# Patient Record
Sex: Male | Born: 1970 | Race: White | Hispanic: No | Smoking: Never smoker
Health system: Southern US, Community
[De-identification: ages and names within clinical notes are randomized; demographics above are authoritative.]

## PROBLEM LIST (undated history)

## (undated) DIAGNOSIS — M1990 Unspecified osteoarthritis, unspecified site: Secondary | ICD-10-CM

## (undated) DIAGNOSIS — F419 Anxiety disorder, unspecified: Secondary | ICD-10-CM

---

## 2001-11-13 DIAGNOSIS — F419 Anxiety disorder, unspecified: Secondary | ICD-10-CM

## 2001-11-13 HISTORY — DX: Anxiety disorder, unspecified: F41.9

## 2015-08-20 ENCOUNTER — Emergency Department (HOSPITAL_COMMUNITY): Payer: Managed Care, Other (non HMO)

## 2015-08-20 ENCOUNTER — Emergency Department (HOSPITAL_COMMUNITY)
Admission: EM | Admit: 2015-08-20 | Discharge: 2015-08-20 | Disposition: A | Discharge status: Home / Self Care | Payer: Managed Care, Other (non HMO) | Attending: Emergency Medicine | Admitting: Emergency Medicine

## 2015-08-20 ENCOUNTER — Encounter (HOSPITAL_COMMUNITY): Payer: Self-pay | Admitting: Emergency Medicine

## 2015-08-20 DIAGNOSIS — F419 Anxiety disorder, unspecified: Secondary | ICD-10-CM | POA: Diagnosis present

## 2015-08-20 DIAGNOSIS — R0602 Shortness of breath: Secondary | ICD-10-CM | POA: Insufficient documentation

## 2015-08-20 DIAGNOSIS — F410 Panic disorder [episodic paroxysmal anxiety] without agoraphobia: Secondary | ICD-10-CM

## 2015-08-20 DIAGNOSIS — R079 Chest pain, unspecified: Secondary | ICD-10-CM | POA: Diagnosis not present

## 2015-08-20 HISTORY — DX: Anxiety disorder, unspecified: F41.9

## 2015-08-20 HISTORY — DX: Unspecified osteoarthritis, unspecified site: M19.90

## 2015-08-20 LAB — I-STAT TROPONIN, ED
TROPONIN I, POC: 0.01 ng/mL (ref 0.00–0.08)
Troponin i, poc: 0 ng/mL (ref 0.00–0.08)

## 2015-08-20 LAB — I-STAT CHEM 8, ED
BUN: 11 mg/dL (ref 6–20)
CALCIUM ION: 1.16 mmol/L (ref 1.12–1.23)
CHLORIDE: 101 mmol/L (ref 101–111)
Creatinine, Ser: 1.2 mg/dL (ref 0.61–1.24)
GLUCOSE: 93 mg/dL (ref 65–99)
HCT: 47 % (ref 39.0–52.0)
HEMOGLOBIN: 16 g/dL (ref 13.0–17.0)
POTASSIUM: 4.2 mmol/L (ref 3.5–5.1)
SODIUM: 140 mmol/L (ref 135–145)
TCO2: 26 mmol/L (ref 0–100)

## 2015-08-20 NOTE — ED Notes (Signed)
Pt states at the time of the panic attack he continued to sweat and had chest tightness. Currently all issues resolved. Denies any Pain. States he feels a little acid reflux right now but I am fine otherwise.

## 2015-08-20 NOTE — ED Provider Notes (Signed)
CSN: [id number]     Arrival date & time 08/20/15  1226 History  By signing my name below, I, Murriel Hopper, attest that this documentation has been prepared under the direction and in the presence of Wynetta Emery, PA-C  Electronically Signed: Murriel Hopper, ED Scribe. 08/20/2015. 1:11 PM.    Chief Complaint  Patient presents with  . Anxiety      The history is provided by the patient. No language interpreter was used.   HPI Comments: Andrew Steele is a 44 y.o. male who presents to the Emergency Department complaining of an anxiety attack that occurred earlier today while he was at work. Pt states that he began to break out into a sweat, had chest tightness, was shaking, and had SOB for around 5-10 minutes. Pt states his chest tightness lasted about 2-3 minutes consistently. Pt reports having anxiety attacks in the past, but notes this one was different because he normally doesn't sweat like he did today. Pt reports sweat was dripping off of him on all parts of his body. Pt states he currently feels fine. Pt states his father passed away at age 109 from MI. Pt denies suicidal ideations, calf pain, leg swelling.     No past medical history on file. No past surgical history on file. No family history on file. Social History  Substance Use Topics  . Smoking status: Not on file  . Smokeless tobacco: Not on file  . Alcohol Use: Not on file    Review of Systems  A complete 10 system review of systems was obtained and all systems are negative except as noted in the HPI and PMH.    Allergies  Review of patient's allergies indicates not on file.  Home Medications   Prior to Admission medications   Not on File   There were no vitals taken for this visit. Physical Exam  Constitutional: He is oriented to person, place, and time. He appears well-developed and well-nourished. No distress.  HENT:  Head: Normocephalic and atraumatic.  Mouth/Throat: Oropharynx is clear and moist.  Eyes:  Conjunctivae are normal.  Neck: Normal range of motion. No JVD present. No tracheal deviation present.  Cardiovascular: Normal rate, regular rhythm and intact distal pulses.   Radial pulse equal bilaterally  Pulmonary/Chest: Effort normal and breath sounds normal. No stridor. No respiratory distress. He has no wheezes. He has no rales. He exhibits no tenderness.  Abdominal: Soft. He exhibits no distension and no mass. There is no tenderness. There is no rebound and no guarding.  Musculoskeletal: Normal range of motion. He exhibits no edema or tenderness.  No calf asymmetry, superficial collaterals, palpable cords, edema, Homans sign negative bilaterally.    Neurological: He is alert and oriented to person, place, and time.  Skin: Skin is warm and dry. He is not diaphoretic.  Psychiatric: He has a normal mood and affect.  Nursing note and vitals reviewed.   ED Course  Procedures (including critical care time)  DIAGNOSTIC STUDIES: Oxygen Saturation is 97% on room air, normal by my interpretation.    COORDINATION OF CARE: 12:40 PM Discussed treatment plan with pt at bedside and pt agreed to plan.   Labs Review Labs Reviewed - No data to display  Imaging Review No results found. I have personally reviewed and evaluated these images and lab results as part of my medical decision-making.   EKG Interpretation None      MDM   Final diagnoses:  Anxiety attack   Filed Vitals:  08/20/15 1239 08/20/15 1243 08/20/15 1625  BP:  126/77 145/88  Pulse:  68 73  Temp:  98.3 F (36.8 C)   TempSrc:  Oral   Resp:  18 17  SpO2: 96% 97% 100%    Andrew Steele is a pleasant 44 y.o. male presenting with a few minutes of chest tightness, diaphoresis, nausea lightheadedness consistent with prior anxiety attacks however patient states this one came on very acutely which is not typical. Patient has no cardiac risk factors aside from family history, his father died at age 76 from MI. Patient is  low risk by heart score, EKG with no acute changes, he does have a shortened PR interval. Troponin is negative. Patient's chest pain has resolved completely. Will hold them in the ED and check a delta troponin.  Delta troponin negative. Patient will be given referral to cardiology, advised him no exertion until he is cleared by cardiology.  This is a shared visit with the attending physician who personally evaluated the patient and agrees with the care plan.    Evaluation does not show pathology that would require ongoing emergent intervention or inpatient treatment. Pt is hemodynamically stable and mentating appropriately. Discussed findings and plan with patient/guardian, who agrees with care plan. All questions answered. Return precautions discussed and outpatient follow up given.      Wynetta Emery, PA-C 08/20/15 1654  Leta Baptist, MD 08/20/15 [phone]

## 2015-08-20 NOTE — Discharge Instructions (Signed)
Please follow with your primary care doctor in the next 2 days for a check-up. They must obtain records for further management.  ° °Do not hesitate to return to the Emergency Department for any new, worsening or concerning symptoms.  ° ° °Panic Attacks °Panic attacks are sudden, short-lived surges of severe anxiety, fear, or discomfort. They may occur for no reason when you are relaxed, when you are anxious, or when you are sleeping. Panic attacks may occur for a number of reasons:  °· Healthy people occasionally have panic attacks in extreme, life-threatening situations, such as war or natural disasters. Normal anxiety is a protective mechanism of the body that helps us react to danger (fight or flight response). °· Panic attacks are often seen with anxiety disorders, such as panic disorder, social anxiety disorder, generalized anxiety disorder, and phobias. Anxiety disorders cause excessive or uncontrollable anxiety. They may interfere with your relationships or other life activities. °· Panic attacks are sometimes seen with other mental illnesses, such as depression and posttraumatic stress disorder. °· Certain medical conditions, prescription medicines, and drugs of abuse can cause panic attacks. °SYMPTOMS  °Panic attacks start suddenly, peak within 20 minutes, and are accompanied by four or more of the following symptoms: °· Pounding heart or fast heart rate (palpitations). °· Sweating. °· Trembling or shaking. °· Shortness of breath or feeling smothered. °· Feeling choked. °· Chest pain or discomfort. °· Nausea or strange feeling in your stomach. °· Dizziness, light-headedness, or feeling like you will faint. °· Chills or hot flushes. °· Numbness or tingling in your lips or hands and feet. °· Feeling that things are not real or feeling that you are not yourself. °· Fear of losing control or going crazy. °· Fear of dying. °Some of these symptoms can mimic serious medical conditions. For example, you may think  you are having a heart attack. Although panic attacks can be very scary, they are not life threatening. °DIAGNOSIS  °Panic attacks are diagnosed through an assessment by your health care provider. Your health care provider will ask questions about your symptoms, such as where and when they occurred. Your health care provider will also ask about your medical history and use of alcohol and drugs, including prescription medicines. Your health care provider may order blood tests or other studies to rule out a serious medical condition. Your health care provider may refer you to a mental health professional for further evaluation. °TREATMENT  °· Most healthy people who have one or two panic attacks in an extreme, life-threatening situation will not require treatment. °· The treatment for panic attacks associated with anxiety disorders or other mental illness typically involves counseling with a mental health professional, medicine, or a combination of both. Your health care provider will help determine what treatment is best for you. °· Panic attacks due to physical illness usually go away with treatment of the illness. If prescription medicine is causing panic attacks, talk with your health care provider about stopping the medicine, decreasing the dose, or substituting another medicine. °· Panic attacks due to alcohol or drug abuse go away with abstinence. Some adults need professional help in order to stop drinking or using drugs. °HOME CARE INSTRUCTIONS  °· Take all medicines as directed by your health care provider.   °· Schedule and attend follow-up visits as directed by your health care provider. It is important to keep all your appointments. °SEEK MEDICAL CARE IF: °· You are not able to take your medicines as prescribed. °· Your symptoms do   not improve or get worse. °SEEK IMMEDIATE MEDICAL CARE IF:  °· You experience panic attack symptoms that are different than your usual symptoms. °· You have serious thoughts  about hurting yourself or others. °· You are taking medicine for panic attacks and have a serious side effect. °MAKE SURE YOU: °· Understand these instructions. °· Will watch your condition. °· Will get help right away if you are not doing well or get worse. °  °This information is not intended to replace advice given to you by your health care provider. Make sure you discuss any questions you have with your health care provider. °  °Document Released: 10/30/2005 Document Revised: 11/04/2013 Document Reviewed: 06/13/2013 °[employer]vier Interactive Patient Education ©2016 [employer] ° °

## 2015-08-20 NOTE — ED Notes (Signed)
Per EMS. Pt works as Merchandiser, retail at [employer]. Pt had a sudden onset of CP, SOB and sweating. By the time EMS arrived the pt was asymptomatic and denied CP. Pt has a hx of anxiety and has had several recent life stressors.

## 2017-07-09 IMAGING — CR DG CHEST 2V
2 series · 2 of 2 positions shown · non-contrast
Comparison: None.

CLINICAL DATA: Central chest pain

EXAM:
CHEST - 2 VIEW

[w chest pa]
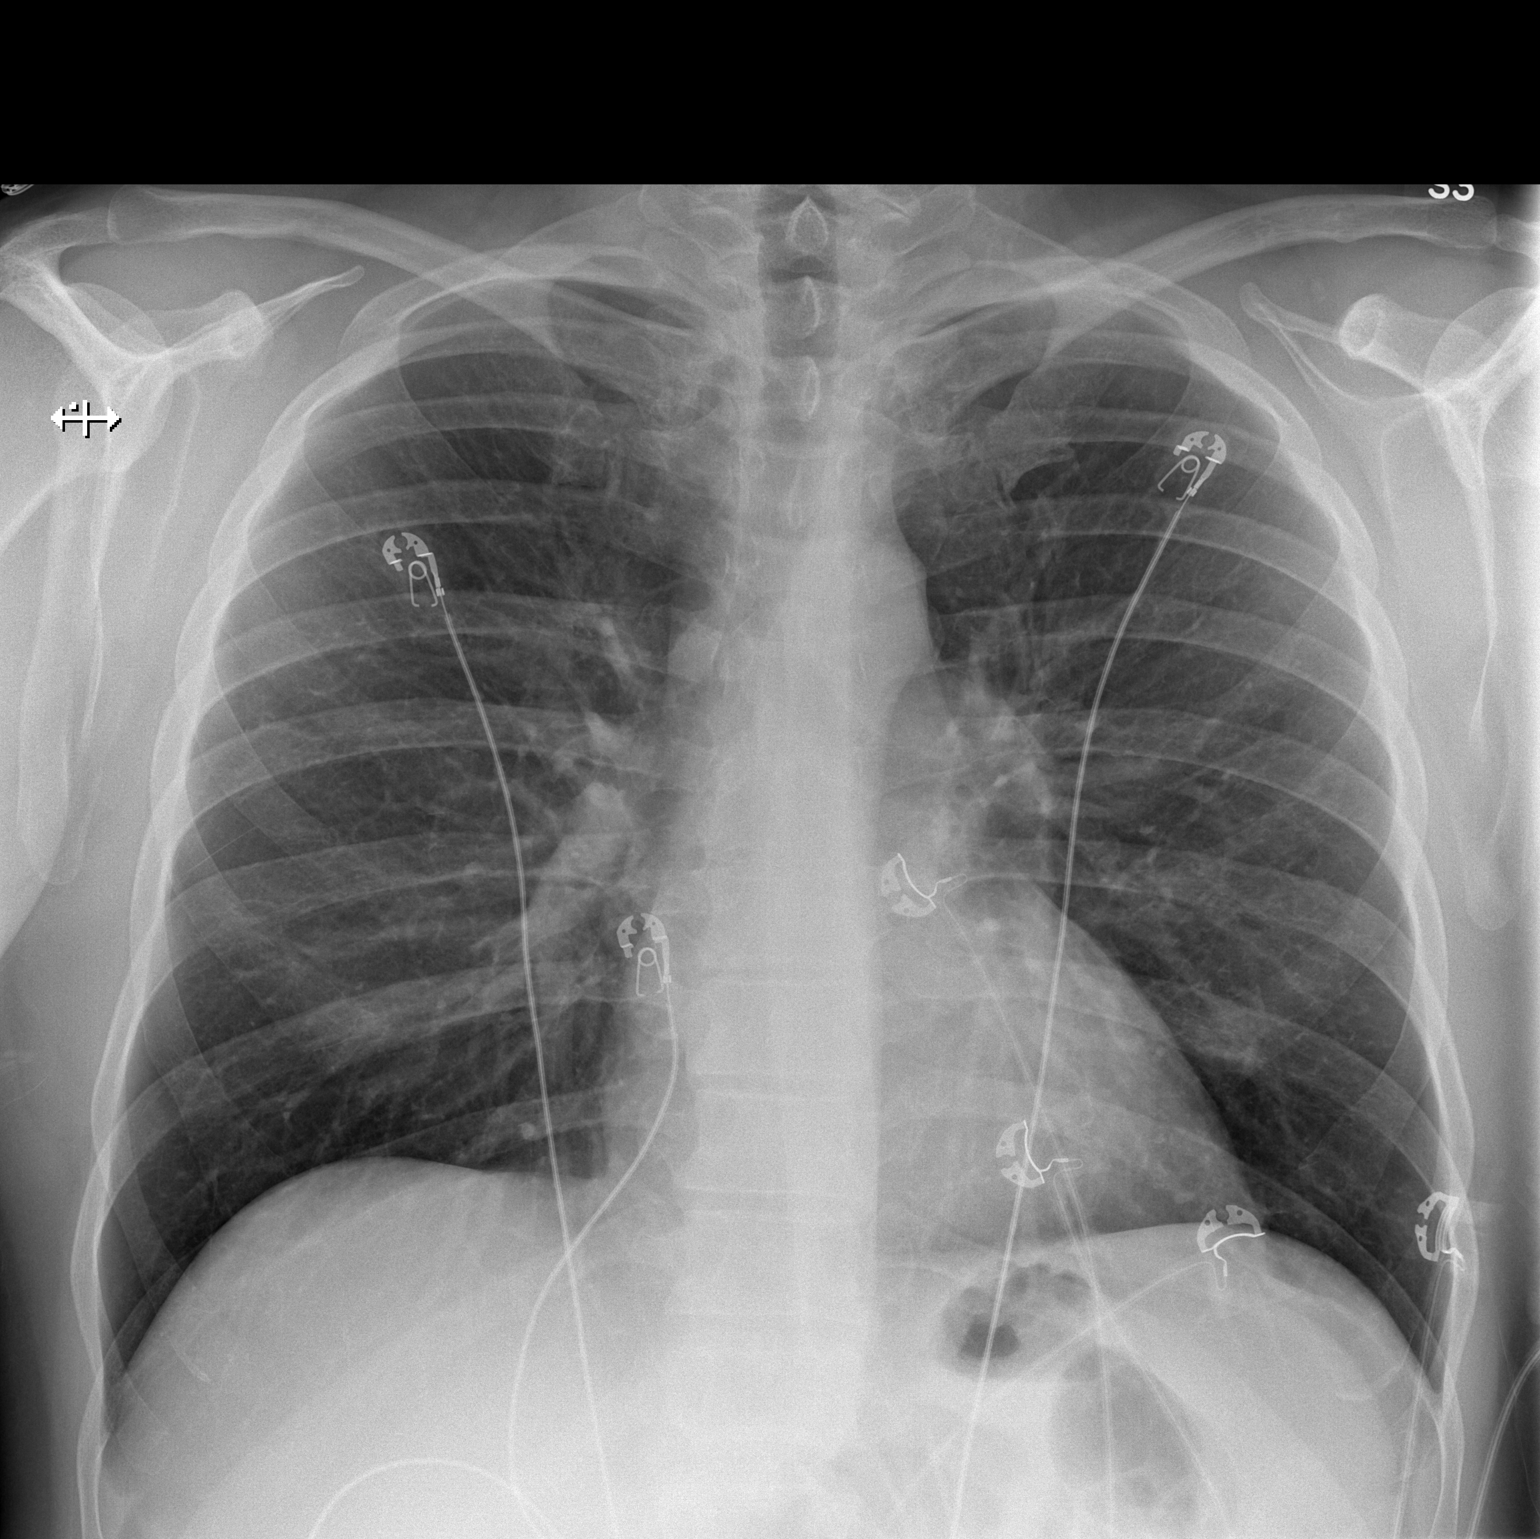

[w chest lat]
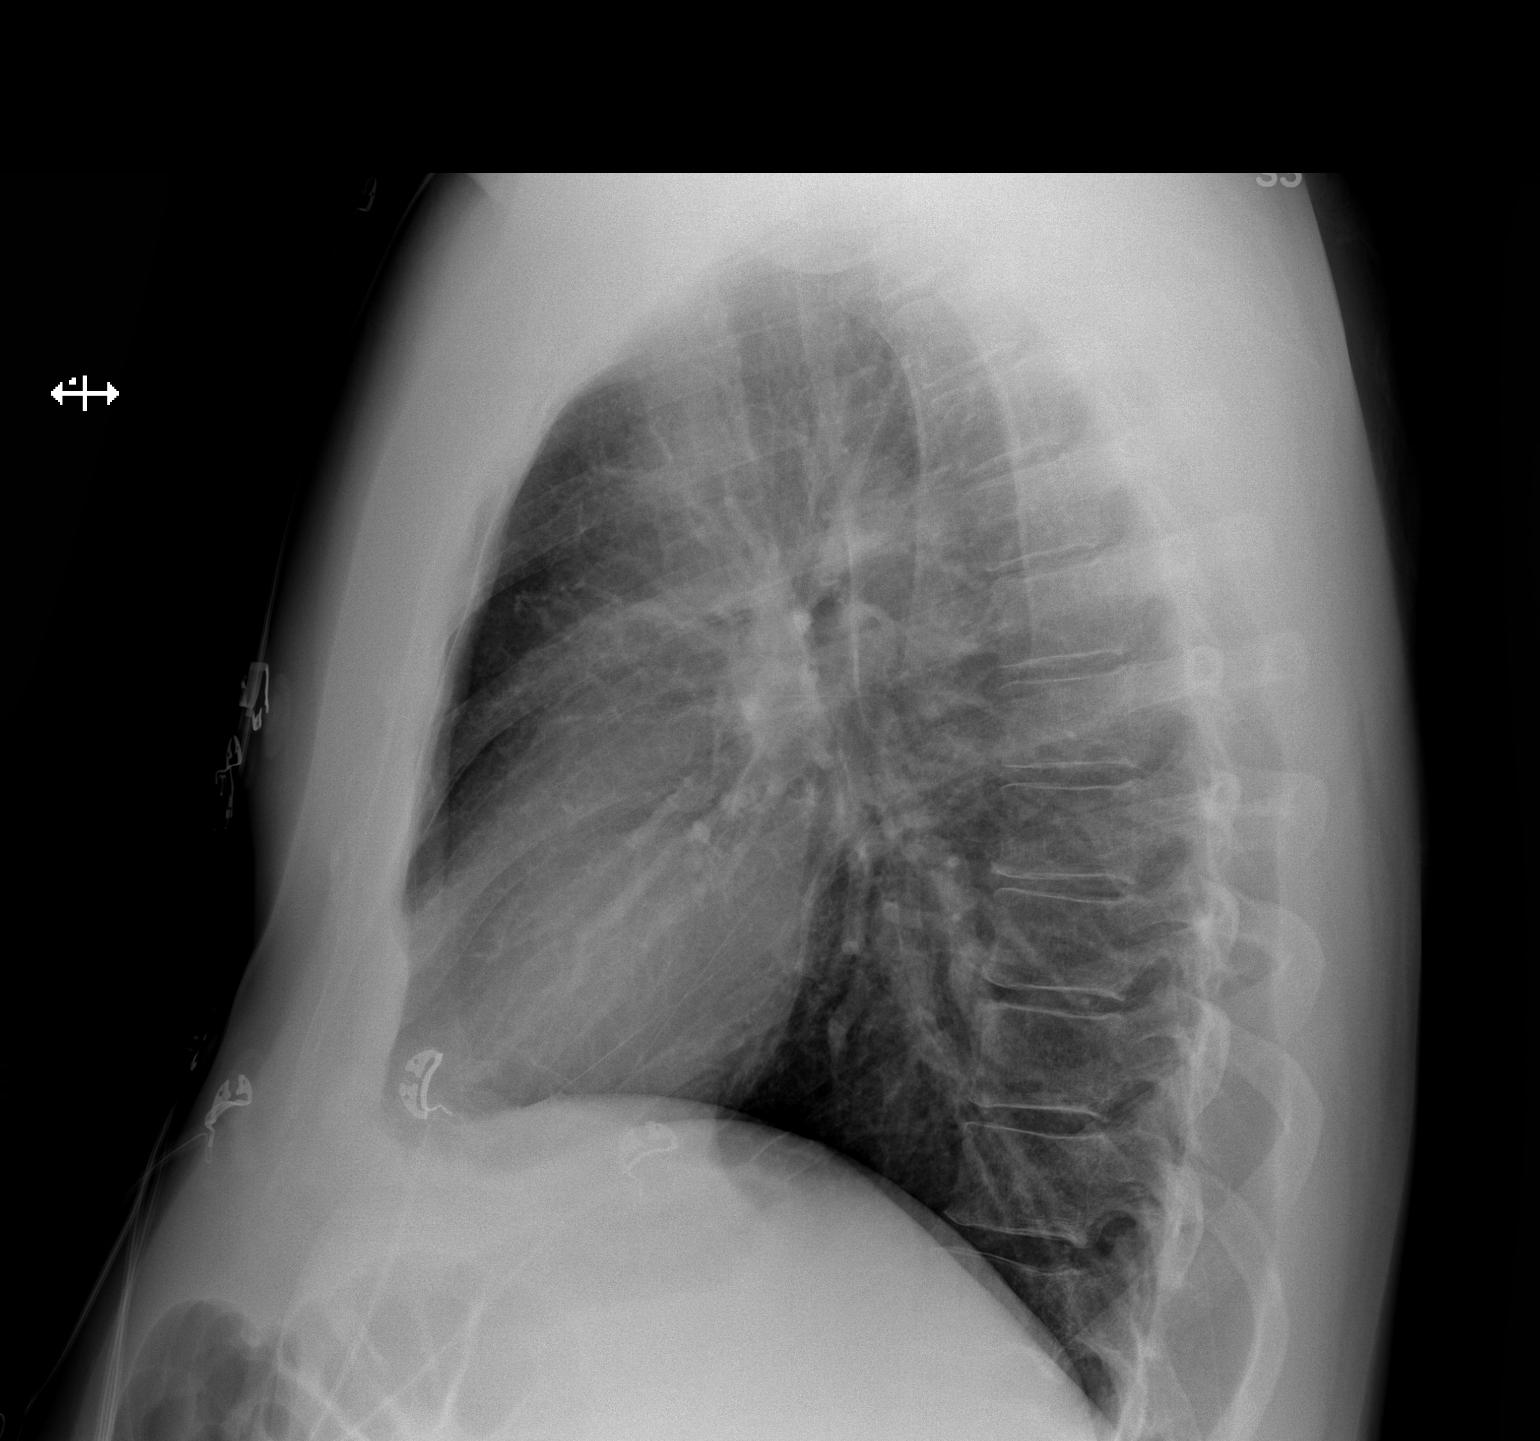

[2 of 2 positions shown; findings below may reference images not displayed]

FINDINGS: The heart size and mediastinal contours are within normal limits.
Both lungs are clear. The visualized skeletal structures are
unremarkable.
IMPRESSION: No active disease.
